# Patient Record
Sex: Male | Born: 2000
Health system: Southern US, Community
[De-identification: ages and names within clinical notes are randomized; demographics above are authoritative.]

---
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
## 2020-04-28 ENCOUNTER — Ambulatory Visit: Payer: Self-pay

## 2020-07-05 DIAGNOSIS — H5712 Ocular pain, left eye: Secondary | ICD-10-CM | POA: Diagnosis not present

## 2020-07-05 DIAGNOSIS — S0502XA Injury of conjunctiva and corneal abrasion without foreign body, left eye, initial encounter: Secondary | ICD-10-CM | POA: Diagnosis not present

## 2020-07-05 DIAGNOSIS — T1512XA Foreign body in conjunctival sac, left eye, initial encounter: Secondary | ICD-10-CM | POA: Diagnosis not present

## 2020-07-05 DIAGNOSIS — H1789 Other corneal scars and opacities: Secondary | ICD-10-CM | POA: Diagnosis not present

## 2020-07-17 DIAGNOSIS — T1502XA Foreign body in cornea, left eye, initial encounter: Secondary | ICD-10-CM | POA: Diagnosis not present

## 2021-02-20 ENCOUNTER — Other Ambulatory Visit: Payer: Self-pay

## 2021-02-20 ENCOUNTER — Emergency Department
Admission: RE | Admit: 2021-02-20 | Discharge: 2021-02-20 | Disposition: A | Discharge status: Home / Self Care | Payer: 59 | Source: Ambulatory Visit | Attending: Family Medicine | Admitting: Family Medicine

## 2021-02-20 VITALS — HR 73 | Temp 97.9°F | Wt 133.0 lb

## 2021-02-20 DIAGNOSIS — L255 Unspecified contact dermatitis due to plants, except food: Secondary | ICD-10-CM | POA: Diagnosis not present

## 2021-02-20 MED ORDER — METHYLPREDNISOLONE SODIUM SUCC 125 MG IJ SOLR
80.0000 mg | Freq: Once | INTRAMUSCULAR | Status: AC
  Administered 2021-02-20: 80 mg via INTRAMUSCULAR

## 2021-02-20 MED ORDER — PREDNISONE 20 MG PO TABS: ORAL_TABLET | ORAL | 0 refills | Status: DC

## 2021-02-20 NOTE — ED Triage Notes (Signed)
Patient c/o possible poison ivy or oak x 2 days.  On his arms and back.  Patient states that it's itching.  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  Also his roommate has poison ivy as well.  Patient has applied Calimine lotion without relief.

## 2021-02-20 NOTE — ED Provider Notes (Signed)
[facility]    CSN: [id number] Arrival date & time: 02/20/21  1858      History   Chief Complaint Chief Complaint  Patient presents with  . Rash    HPI Brandon Baldwin is a 20 y.o. male.   Patient works outdoors Therapist, music and believes that he contacted poison oak or ivy two days ago.  He developed a pruritic rash on his forearms and hands that is now spreading to his back, legs, and lower abdomen.  He feels well otherwise.  His roommate and co-worker has a poison ivy rash.  The history is provided by the patient.  Poison [place] This is a new problem. The problem occurs constantly. The problem has been gradually worsening. Nothing aggravates the symptoms. Nothing relieves the symptoms. Treatments tried: calamine lotion.    History reviewed. No pertinent past medical history.  There are no problems to display for this patient.   History reviewed. No pertinent surgical history.     Home Medications    Prior to Admission medications   Medication Sig Start Date End Date Taking? Authorizing Provider  predniSONE (DELTASONE) 20 MG tablet Take one tab by mouth twice daily for 4 days, then one daily for 3 days. Take with food. 02/20/21  Yes Lattie Haw, MD  albuterol (PROVENTIL HFA) 108 (90 Base) MCG/ACT inhaler Inhale 2 puffs into the lungs every 6 (six) hours as needed for wheezing or shortness of breath (wheezing). 01/26/18 02/25/18  Benay Pike, NP  azithromycin (ZITHROMAX) 250 MG tablet Take 2 tabs PO x 1 dose, then 1 tab PO QD x 4 days Patient not taking: No sig reported 11/10/17   Bing Neighbors, FNP  HYDROcodone-acetaminophen (NORCO/VICODIN) 5-325 MG tablet Take 2 tablets by mouth every 4 (four) hours as needed. 05/27/19   Mickie Bail, NP  ibuprofen (ADVIL) 800 MG tablet Take 1 tablet (800 mg total) by mouth every 8 (eight) hours as needed. 05/27/19   Mickie Bail, NP  ipratropium (ATROVENT) 0.03 % nasal spray Place 2 sprays into both nostrils  2 (two) times daily. Patient not taking: No sig reported 11/10/17   Bing Neighbors, FNP  montelukast (SINGULAIR) 10 MG tablet Take 1 tablet (10 mg total) by mouth at bedtime. 01/26/18 02/25/18  Benay Pike, NP  triamcinolone cream (KENALOG) 0.1 % Apply 1 application topically 2 (two) times daily. 06/01/18   Zachery Dauer, NP    Family History No family history on file.  Social History Social History   Tobacco Use  . Smoking status: Never Smoker  . Smokeless tobacco: Never Used     Allergies   Penicillins   Review of Systems Review of Systems  Constitutional: Negative for chills, diaphoresis, fatigue and fever.  HENT: Negative for mouth sores.   Skin: Positive for rash.  All other systems reviewed and are negative.    Physical Exam Triage Vital Signs ED Triage Vitals  Enc Vitals Group     BP --      Pulse Rate 02/20/21 2025 73     Resp --      Temp 02/20/21 2025 97.9 F (36.6 C)     Temp Source 02/20/21 2025 Oral     SpO2 02/20/21 2025 98 %     Weight 02/20/21 2026 133 lb (60.3 kg)     Height --      Head Circumference --      Peak Flow --      Pain Score  02/20/21 2026 0     Pain Loc --      Pain Edu? --      Excl. in [place]? --    No data found.  Updated Vital Signs Pulse 73   Temp 97.9 F (36.6 C) (Oral)   Wt 60.3 kg   SpO2 98%   BMI 20.22 kg/m   Visual Acuity Right Eye Distance:   Left Eye Distance:   Bilateral Distance:    Right Eye Near:   Left Eye Near:    Bilateral Near:     Physical Exam Vitals and nursing note reviewed.  Constitutional:      General: He is not in acute distress.    Appearance: He is not ill-appearing.  HENT:     Head: Normocephalic.     Mouth/Throat:     Pharynx: Oropharynx is clear.  Eyes:     Conjunctiva/sclera: Conjunctivae normal.  Cardiovascular:     Rate and Rhythm: Normal rate.  Pulmonary:     Effort: Pulmonary effort is normal.  Musculoskeletal:        General: No swelling.  Skin:    General:  Skin is warm and dry.     Findings: Rash present.          Comments: Scattered small maculopapular erythematous lesions in distribution as noted on diagram.  Some lesions are slightly vesicular.  Neurological:     Mental Status: He is alert and oriented to person, place, and time.      UC Treatments / Results  Labs (all labs ordered are listed, but only abnormal results are displayed) Labs Reviewed - No data to display  EKG   Radiology No results found.  Procedures Procedures (including critical care time)  Medications Ordered in UC Medications  methylPREDNISolone sodium succinate (SOLU-MEDROL) 125 mg/2 mL injection 80 mg (has no administration in time range)    Initial Impression / Assessment and Plan / UC Course  I have reviewed the triage vital signs and the nursing notes.  Pertinent labs & imaging results that were available during my care of the patient were reviewed by me and considered in my medical decision making (see chart for details).    Administered Solumedrol 80mg  IM, then begin prednisone burst/taper. Followup with Family Doctor if not improved in one week.    Final Clinical Impressions(s) / UC Diagnoses   Final diagnoses:  Rhus dermatitis     Discharge Instructions     Begin prednisone Saturday 02/21/21.  May take Benadryl at bedtime if needed for itching.    ED Prescriptions    Medication Sig Dispense Auth. Provider   predniSONE (DELTASONE) 20 MG tablet Take one tab by mouth twice daily for 4 days, then one daily for 3 days. Take with food. 11 tablet 02/23/21, MD        Lattie Haw, MD 02/21/21 [phone]

## 2021-02-20 NOTE — Discharge Instructions (Addendum)
Begin prednisone Saturday 02/21/21.  May take Benadryl at bedtime if needed for itching.

## 2021-04-26 IMAGING — DX LEFT WRIST - COMPLETE 3+ VIEW
4 series · 4 of 4 positions shown · non-contrast
Comparison: None.

CLINICAL DATA: Fall from skateboard

EXAM:
LEFT WRIST - COMPLETE 3+ VIEW

[wrist pa]
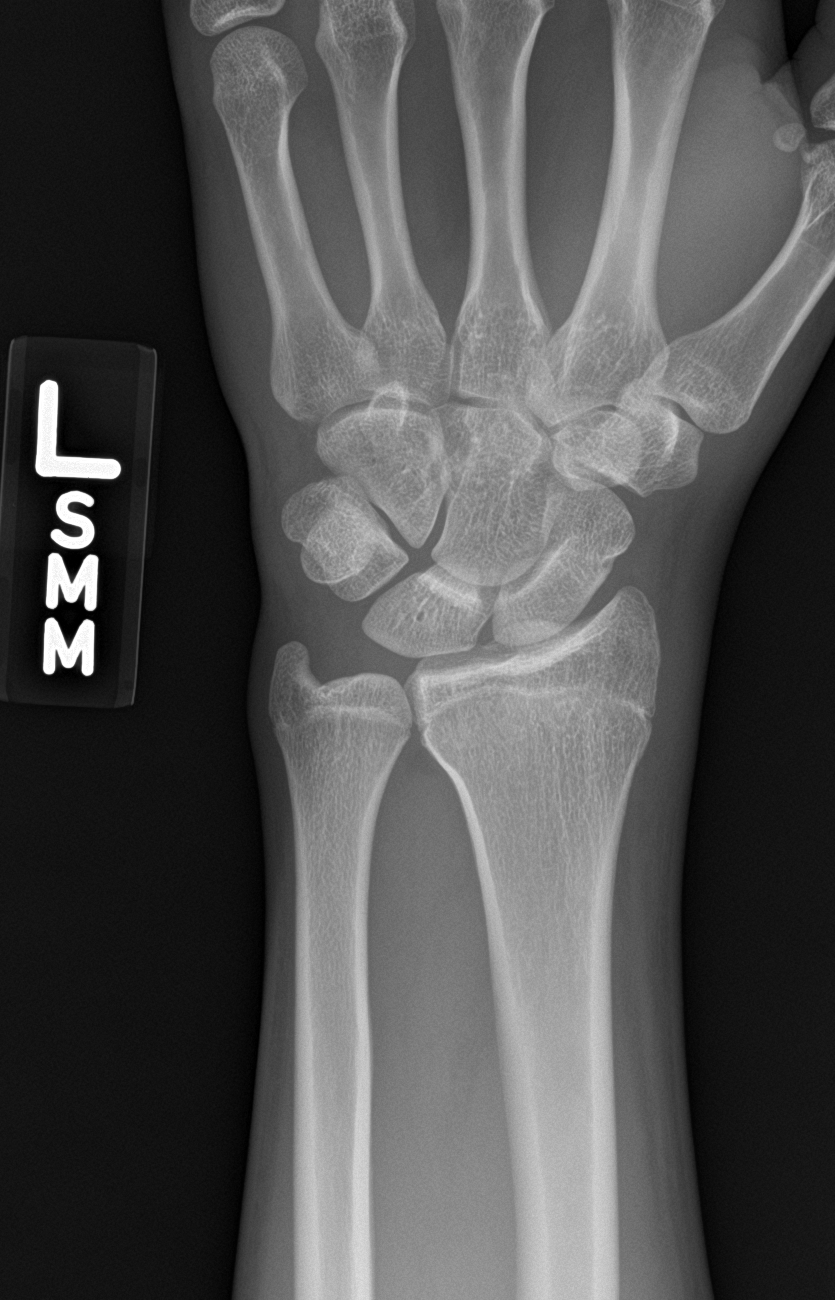

[wrist navicular]
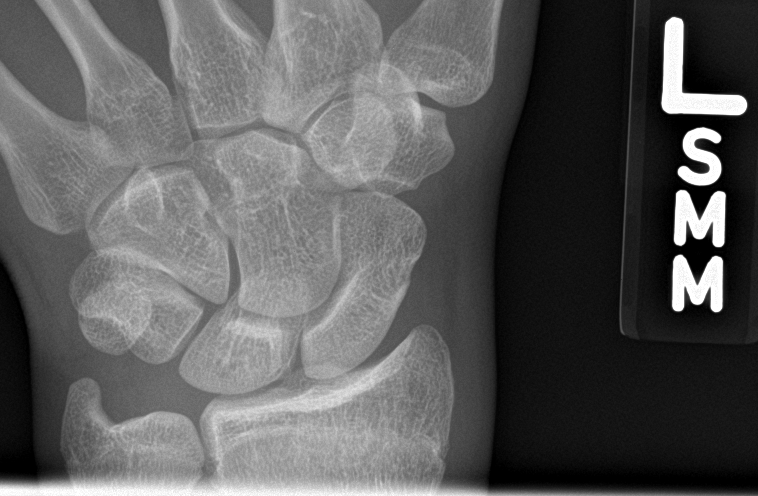

[wrist obl]
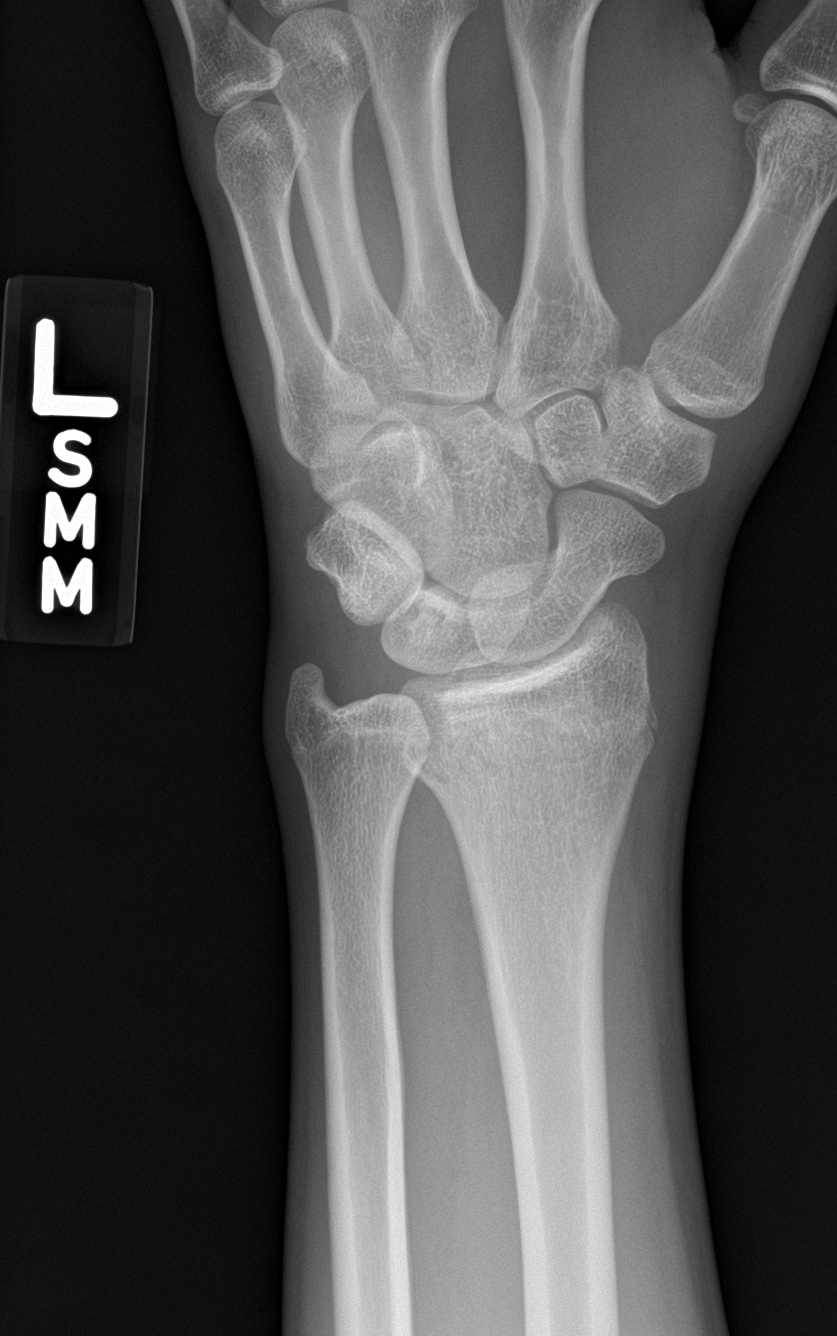

[wrist lat]
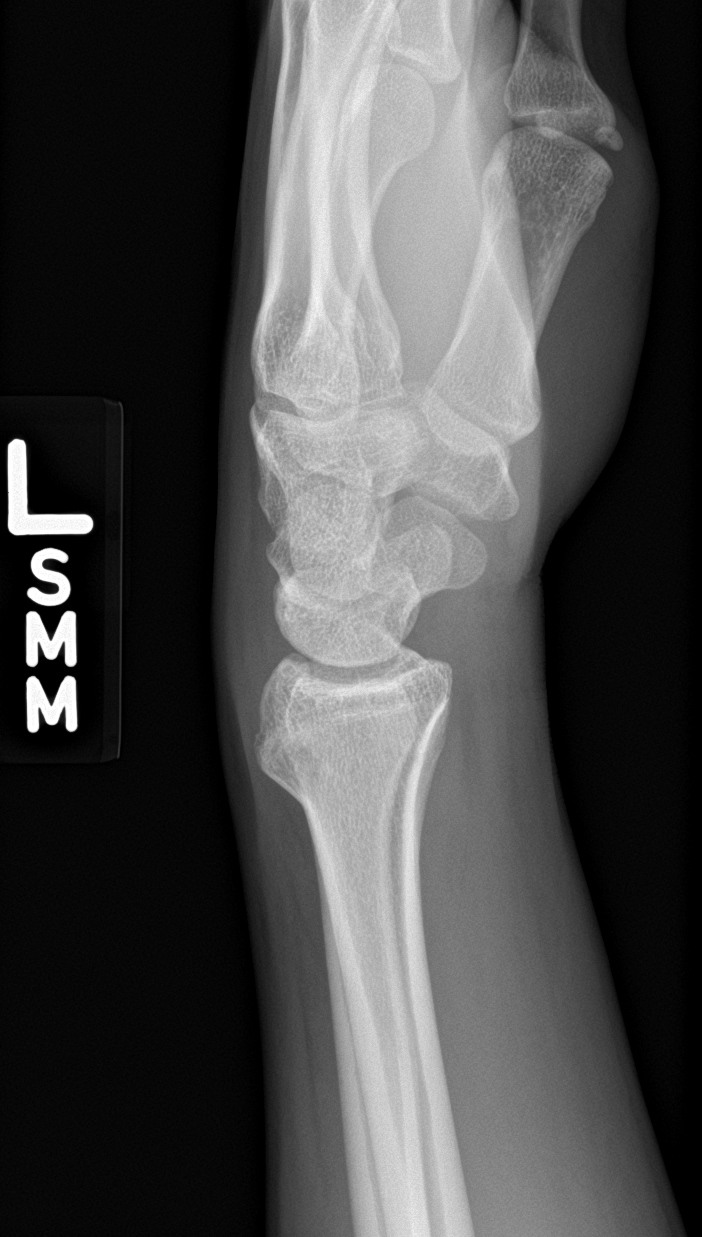

[4 of 4 positions shown; findings below may reference images not displayed]

FINDINGS: Frontal, oblique, lateral, and ulnar deviation scaphoid images were
obtained. There is a transversely oriented fracture of the distal
radial metaphysis with alignment essentially anatomic. No other
fracture. No dislocation. Joint spaces appear intact. No erosive
change.
IMPRESSION: Nondisplaced transversely oriented fracture distal radial
metaphysis. No other fracture. No dislocation. No evident
arthropathic change.

These results will be called to the ordering clinician or
representative by the Radiologist Assistant, and communication
documented in the PACS or zVision Dashboard.

## 2021-07-16 ENCOUNTER — Other Ambulatory Visit: Payer: Self-pay

## 2021-07-16 ENCOUNTER — Other Ambulatory Visit (HOSPITAL_COMMUNITY): Payer: Self-pay

## 2021-07-16 ENCOUNTER — Ambulatory Visit
Admission: RE | Admit: 2021-07-16 | Discharge: 2021-07-16 | Disposition: A | Discharge status: Home / Self Care | Payer: 59 | Source: Ambulatory Visit | Attending: Emergency Medicine | Admitting: Emergency Medicine

## 2021-07-16 VITALS — BP 115/77 | HR 67 | Temp 97.6°F | Resp 14

## 2021-07-16 DIAGNOSIS — L237 Allergic contact dermatitis due to plants, except food: Secondary | ICD-10-CM | POA: Diagnosis not present

## 2021-07-16 MED ORDER — PREDNISONE 10 MG PO TABS
ORAL_TABLET | ORAL | 0 refills | Status: DC
  Filled 2021-07-16: qty 21, 6d supply, fill #0

## 2021-07-16 MED ORDER — TRIAMCINOLONE ACETONIDE 0.1 % EX CREA
1.0000 "application " | TOPICAL_CREAM | Freq: Two times a day (BID) | CUTANEOUS | 0 refills | Status: DC
  Filled 2021-07-16: qty 80, 30d supply, fill #0

## 2021-07-16 MED ORDER — METHYLPREDNISOLONE SODIUM SUCC 125 MG IJ SOLR
125.0000 mg | Freq: Once | INTRAMUSCULAR | Status: AC
  Administered 2021-07-16: 125 mg via INTRAMUSCULAR

## 2021-07-16 MED ORDER — CETIRIZINE HCL 10 MG PO CAPS
10.0000 mg | ORAL_CAPSULE | Freq: Every day | ORAL | 0 refills | Status: AC
  Filled 2021-07-16: qty 10, 10d supply, fill #0

## 2021-07-16 NOTE — Discharge Instructions (Signed)
We gave you an injection of Solu-Medrol Begin prednisone taper x6 days-begin with 6 tablets on day 1, decrease by 1 tablet each day until complete-6, 5, 4, 3, 2, 1-take with food and earlier in the day if possible May use triamcinolone cream for areas still itchy Continue antihistamines-daily cetirizine/Zyrtec or loratadine/Allegra during the day, supplement Benadryl in the evening Please follow-up if rash not resolving or worsening

## 2021-07-16 NOTE — ED Triage Notes (Signed)
Pt reports possible poison ivy that started last night around 5 pm.   Pt does have rashes to left & right neck and right AC.   Pt states he has been using cream to area (Poison ivy lotion)

## 2021-07-16 NOTE — ED Provider Notes (Signed)
[facility]    CSN: [id number] Arrival date & time: 07/16/21  1249      History   Chief Complaint Chief Complaint  Patient presents with   Poison Ivy    HPI Brandon Baldwin is a 20 y.o. male presenting today for evaluation of a rash.  Began to break out in a rash beginning yesterday.  Similar to past reactions to poison ivy.  Feels involvement of face especially near right eye, neck and upper extremities.  HPI  No past medical history on file.  There are no problems to display for this patient.   No past surgical history on file.     Home Medications    Prior to Admission medications   Medication Sig Start Date End Date Taking? Authorizing Provider  Cetirizine HCl 10 MG CAPS Take 1 capsule (10 mg total) by mouth daily for 10 days. 07/16/21 07/26/21 Yes Hind Chesler C, PA-C  predniSONE (DELTASONE) 10 MG tablet Take 6 tablets by mouth on day 1 and decrease by 1 tablet daily-6,5,4,3,2,1. Take with food. 07/16/21  Yes Michalina Calbert C, PA-C  triamcinolone cream (KENALOG) 0.1 % Apply 1 to affected areas topically 2 (two) times daily. 07/16/21  Yes Mubarak Bevens C, PA-C  ibuprofen (ADVIL) 800 MG tablet Take 1 tablet (800 mg total) by mouth every 8 (eight) hours as needed. 05/27/19   Mickie Bail, NP    Family History No family history on file.  Social History Social History   Tobacco Use   Smoking status: Never   Smokeless tobacco: Never  Vaping Use   Vaping Use: Every day  Substance Use Topics   Alcohol use: Yes   Drug use: Yes    Types: Marijuana     Allergies   Penicillins   Review of Systems Review of Systems  Constitutional:  Negative for fatigue and fever.  Eyes:  Negative for redness, itching and visual disturbance.  Respiratory:  Negative for shortness of breath.   Cardiovascular:  Negative for chest pain and leg swelling.  Gastrointestinal:  Negative for nausea and vomiting.  Musculoskeletal:  Negative for arthralgias and myalgias.   Skin:  Positive for color change and rash. Negative for wound.  Neurological:  Negative for dizziness, syncope, weakness, light-headedness and headaches.    Physical Exam Triage Vital Signs ED Triage Vitals  Enc Vitals Group     BP 07/16/21 1335 115/77     Pulse Rate 07/16/21 1335 67     Resp 07/16/21 1335 14     Temp 07/16/21 1335 97.6 F (36.4 C)     Temp Source 07/16/21 1335 Oral     SpO2 07/16/21 1335 98 %     Weight --      Height --      Head Circumference --      Peak Flow --      Pain Score 07/16/21 1350 0     Pain Loc --      Pain Edu? --      Excl. in [place]? --    No data found.  Updated Vital Signs BP 115/77 (BP Location: Left Arm)   Pulse 67   Temp 97.6 F (36.4 C) (Oral)   Resp 14   SpO2 98%   Visual Acuity Right Eye Distance:   Left Eye Distance:   Bilateral Distance:    Right Eye Near:   Left Eye Near:    Bilateral Near:     Physical Exam Vitals and nursing note  reviewed.  Constitutional:      Appearance: He is well-developed.     Comments: No acute distress  HENT:     Head: Normocephalic and atraumatic.     Nose: Nose normal.  Eyes:     Conjunctiva/sclera: Conjunctivae normal.  Cardiovascular:     Rate and Rhythm: Normal rate.  Pulmonary:     Effort: Pulmonary effort is normal. No respiratory distress.     Comments: Speaking in full sentences Abdominal:     General: There is no distension.  Musculoskeletal:        General: Normal range of motion.     Cervical back: Neck supple.  Skin:    General: Skin is warm and dry.     Comments: Erythematous papular/vesicular rash noted to extremities, erythema noted to neck and slightly on lower face, no obvious lesions near eyes  Neurological:     Mental Status: He is alert and oriented to person, place, and time.     UC Treatments / Results  Labs (all labs ordered are listed, but only abnormal results are displayed) Labs Reviewed - No data to display  EKG   Radiology No results  found.  Procedures Procedures (including critical care time)  Medications Ordered in UC Medications  methylPREDNISolone sodium succinate (SOLU-MEDROL) 125 mg/2 mL injection 125 mg (125 mg Intramuscular Given 07/16/21 1450)    Initial Impression / Assessment and Plan / UC Course  I have reviewed the triage vital signs and the nursing notes.  Pertinent labs & imaging results that were available during my care of the patient were reviewed by me and considered in my medical decision making (see chart for details).     Contact dermatitis secondary to poison ivy, providing Solu-Medrol prior to discharge and continuing on prednisone taper x6 days, triamcinolone topically and continuing antihistamines.  Continue to monitor rash for resolution,Discussed strict return precautions. Patient verbalized understanding and is agreeable with plan.  Final Clinical Impressions(s) / UC Diagnoses   Final diagnoses:  Poison ivy dermatitis     Discharge Instructions      We gave you an injection of Solu-Medrol Begin prednisone taper x6 days-begin with 6 tablets on day 1, decrease by 1 tablet each day until complete-6, 5, 4, 3, 2, 1-take with food and earlier in the day if possible May use triamcinolone cream for areas still itchy Continue antihistamines-daily cetirizine/Zyrtec or loratadine/Allegra during the day, supplement Benadryl in the evening Please follow-up if rash not resolving or worsening     ED Prescriptions     Medication Sig Dispense Auth. Provider   predniSONE (DELTASONE) 10 MG tablet Take 6 tablets by mouth on day 1 and decrease by 1 tablet daily-6,5,4,3,2,1. Take with food. 21 tablet Quirino Kakos C, PA-C   Cetirizine HCl 10 MG CAPS Take 1 capsule (10 mg total) by mouth daily for 10 days. 10 capsule Angellee Cohill C, PA-C   triamcinolone cream (KENALOG) 0.1 % Apply 1 to affected areas topically 2 (two) times daily. 80 g Akaysha Cobern, [place] C, PA-C      PDMP not reviewed this  encounter.   Lew Dawes, [place] 07/17/21 [phone]

## 2021-07-17 ENCOUNTER — Other Ambulatory Visit (HOSPITAL_COMMUNITY): Payer: Self-pay

## 2021-08-26 DIAGNOSIS — T1502XA Foreign body in cornea, left eye, initial encounter: Secondary | ICD-10-CM | POA: Diagnosis not present

## 2022-06-09 ENCOUNTER — Ambulatory Visit
Admission: EM | Admit: 2022-06-09 | Discharge: 2022-06-09 | Disposition: A | Discharge status: Home / Self Care | Payer: 59 | Attending: Emergency Medicine | Admitting: Emergency Medicine

## 2022-06-09 DIAGNOSIS — Z20822 Contact with and (suspected) exposure to covid-19: Secondary | ICD-10-CM | POA: Diagnosis not present

## 2022-06-09 DIAGNOSIS — B349 Viral infection, unspecified: Secondary | ICD-10-CM

## 2022-06-09 LAB — RESP PANEL BY RT-PCR (FLU A&B, COVID) ARPGX2
Influenza A by PCR: NEGATIVE
Influenza B by PCR: NEGATIVE
SARS Coronavirus 2 by RT PCR: NEGATIVE

## 2022-06-09 MED ORDER — OSELTAMIVIR PHOSPHATE 75 MG PO CAPS: 75.0000 mg | ORAL_CAPSULE | Freq: Two times a day (BID) | ORAL | 0 refills | Status: AC

## 2022-06-09 NOTE — ED Triage Notes (Signed)
Pt c/o chills, sore throat, back aches, and headache that began yesterday.   Home interventions: sudafed, motrin

## 2022-06-09 NOTE — ED Provider Notes (Signed)
[facility]    CSN: [id number] Arrival date & time: 06/09/22  1634    HISTORY   Chief Complaint  Patient presents with   Generalized Body Aches   Headache   Sore Throat   HPI Brandon Baldwin is a pleasant, 21 y.o. male who presents to urgent care today. Patient complains of chills, congestion, runny nose, sore throat, backache and headache that began yesterday.  Patient states has been taking Sudafed and Motrin without meaningful relief of symptoms.  On arrival, patient's blood pressure and oxygen saturation is normal however he has a temperature of 100.9 and a pulse of 111.  Patient denies known sick contacts, nausea, vomiting, diarrhea, cough.  The history is provided by the patient.   History reviewed. No pertinent past medical history. There are no problems to display for this patient.  History reviewed. No pertinent surgical history.  Home Medications    Prior to Admission medications   Medication Sig Start Date End Date Taking? Authorizing Provider  Cetirizine HCl 10 MG CAPS Take 1 capsule (10 mg total) by mouth daily for 10 days. 07/16/21 07/26/21  Wieters, Elesa Hacker, PA-C    Family History History reviewed. No pertinent family history. Social History Social History   Tobacco Use   Smoking status: Never   Smokeless tobacco: Never  Vaping Use   Vaping Use: Every day  Substance Use Topics   Alcohol use: Yes   Drug use: Yes    Types: Marijuana   Allergies   Penicillins  Review of Systems Review of Systems Pertinent findings revealed after performing a 14 point review of systems has been noted in the history of present illness.  Physical Exam Triage Vital Signs ED Triage Vitals  Enc Vitals Group     BP 08/21/21 0827 (!) 147/82     Pulse Rate 08/21/21 0827 72     Resp 08/21/21 0827 18     Temp 08/21/21 0827 98.3 F (36.8 C)     Temp Source 08/21/21 0827 Oral     SpO2 08/21/21 0827 98 %     Weight --      Height --      Head Circumference  --      Peak Flow --      Pain Score 08/21/21 0826 5     Pain Loc --      Pain Edu? --      Excl. in [place]? --   No data found.  Updated Vital Signs BP 112/76 (BP Location: Right Arm)   Pulse (!) 111   Temp (!) 100.9 F (38.3 C) (Oral)   Resp 16   SpO2 97%   Physical Exam Constitutional:      Appearance: He is ill-appearing.  HENT:     Head: Normocephalic and atraumatic.     Salivary Glands: Right salivary gland is not diffusely enlarged or tender. Left salivary gland is not diffusely enlarged or tender.     Right Ear: Hearing, tympanic membrane, ear canal and external ear normal.     Left Ear: Hearing, tympanic membrane, ear canal and external ear normal.     Nose: Congestion and rhinorrhea present. Rhinorrhea is clear.     Right Sinus: No maxillary sinus tenderness or frontal sinus tenderness.     Left Sinus: No maxillary sinus tenderness.     Mouth/Throat:     Mouth: Mucous membranes are moist.     Pharynx: Pharyngeal swelling, posterior oropharyngeal erythema and uvula swelling present. No oropharyngeal  exudate.     Tonsils: No tonsillar exudate. 0 on the right. 0 on the left.  Eyes:     General: Lids are normal. Vision grossly intact.     Extraocular Movements: Extraocular movements intact.     Conjunctiva/sclera: Conjunctivae normal.     Pupils: Pupils are equal, round, and reactive to light.  Cardiovascular:     Rate and Rhythm: Regular rhythm. Tachycardia present. No extrasystoles are present.    Pulses: Normal pulses.     Heart sounds: Normal heart sounds, S1 normal and S2 normal. No murmur heard. Pulmonary:     Effort: Pulmonary effort is normal. No accessory muscle usage, prolonged expiration or respiratory distress.     Breath sounds: No stridor. No wheezing, rhonchi or rales.     Comments: Turbulent breath sounds throughout without wheeze, rale, rhonchi. Abdominal:     General: Abdomen is flat. Bowel sounds are normal.     Palpations: Abdomen is soft.   Musculoskeletal:        General: Normal range of motion.     Cervical back: Full passive range of motion without pain, normal range of motion and neck supple.  Lymphadenopathy:     Cervical: Cervical adenopathy present.     Right cervical: Superficial cervical adenopathy and posterior cervical adenopathy present.     Left cervical: Superficial cervical adenopathy and posterior cervical adenopathy present.  Skin:    General: Skin is warm and dry.  Neurological:     General: No focal deficit present.     Mental Status: He is alert and oriented to person, place, and time.     Motor: Motor function is intact.     Coordination: Coordination is intact.     Gait: Gait is intact.     Deep Tendon Reflexes: Reflexes are normal and symmetric.  Psychiatric:        Attention and Perception: Attention and perception normal.        Mood and Affect: Mood and affect normal.        Speech: Speech normal.        Behavior: Behavior normal. Behavior is cooperative.        Thought Content: Thought content normal.     Visual Acuity Right Eye Distance:   Left Eye Distance:   Bilateral Distance:    Right Eye Near:   Left Eye Near:    Bilateral Near:     UC Couse / Diagnostics / Procedures:     Radiology No results found.  Procedures Procedures (including critical care time) EKG  Pending results:  Labs Reviewed  POCT INFLUENZA A/B    Medications Ordered in UC: Medications - No data to display  UC Diagnoses / Final Clinical Impressions(s)   I have reviewed the triage vital signs and the nursing notes.  Pertinent labs & imaging results that were available during my care of the patient were reviewed by me and considered in my medical decision making (see chart for details).    Final diagnoses:  Viral illness   Rapid antigen test for influenza was negative today, patient advised to begin Tamiflu despite this.  PCR COVID and flu testing performed.  We will adjust treatment based on  results of testing.  Patient advised to remain off from work for the next several days until feeling better.  Conservative care recommended.  Return precautions advised.  ED Prescriptions     Medication Sig Dispense Auth. Provider   oseltamivir (TAMIFLU) 75 MG capsule Take 1 capsule (  75 mg total) by mouth every 12 (twelve) hours for 5 days. 10 capsule Theadora Rama Scales, PA-C      PDMP not reviewed this encounter.  Disposition Upon Discharge:  Condition: stable for discharge home Home: take medications as prescribed; routine discharge instructions as discussed; follow up as advised.  Patient presented with an acute illness with associated systemic symptoms and significant discomfort requiring urgent management. In my opinion, this is a condition that a prudent lay person (someone who possesses an average knowledge of health and medicine) may potentially expect to result in complications if not addressed urgently such as respiratory distress, impairment of bodily function or dysfunction of bodily organs.   Routine symptom specific, illness specific and/or disease specific instructions were discussed with the patient and/or caregiver at length.   As such, the patient has been evaluated and assessed, work-up was performed and treatment was provided in alignment with urgent care protocols and evidence based medicine.  Patient/parent/caregiver has been advised that the patient may require follow up for further testing and treatment if the symptoms continue in spite of treatment, as clinically indicated and appropriate.  If the patient was tested for COVID-19, Influenza and/or RSV, then the patient/parent/guardian was advised to isolate at home pending the results of his/her diagnostic coronavirus test and potentially longer if they're positive. I have also advised pt that if his/her COVID-19 test returns positive, it's recommended to self-isolate for at least 10 days after symptoms first appeared  AND until fever-free for 24 hours without fever reducer AND other symptoms have improved or resolved. Discussed self-isolation recommendations as well as instructions for household member/close contacts as per the [facility] [place] [facility], and also gave patient the COVID packet with this information.  Patient/parent/caregiver has been advised to return to the [facility] or PCP in 3-5 days if no better; to PCP or the Emergency Department if new signs and symptoms develop, or if the current signs or symptoms continue to change or worsen for further workup, evaluation and treatment as clinically indicated and appropriate  The patient will follow up with their current PCP if and as advised. If the patient does not currently have a PCP we will assist them in obtaining one.   The patient may need specialty follow up if the symptoms continue, in spite of conservative treatment and management, for further workup, evaluation, consultation and treatment as clinically indicated and appropriate.  Patient/parent/caregiver verbalized understanding and agreement of plan as discussed.  All questions were addressed during visit.  Please see discharge instructions below for further details of plan.  Discharge Instructions:   Discharge Instructions      Your symptoms and physical exam findings are concerning for a viral respiratory infection.    Your rapid influenza antigen test today was negative. I recommend that you begin taking Tamiflu now for empiric treatment of presumed influenza based on the history provided to me today along with my physical exam findings despite the negative rapid test which does not catch all cases of influenza.   Tamiflu is an antiviral medication that decreases the severity, duration and transmissibility of influenza virus by preventing the virus from reproducing itself in your body.  Please take 1 capsule twice daily for the next 5 days.  If sent a prescription to your pharmacy.   You were tested  for both COVID-19 and influenza using a PCR test today.  The results of your viral testing will be posted to your MyChart once it is complete, typically this takes  6 to 12 hours.    If your influenza test is positive, please finish the full 5-day course of Tamiflu.    If your COVID-19 test is positive, you will be contacted by phone and further recommendations will be provided for you.  Please discontinue Tamiflu.    Rest, drinking plenty of clear liquids and time for the best known cares for influenza.  I recommend that you anticipate returning to work on Monday and remain out of work longer if you are still not feeling well.  Thank you for visiting urgent care today.         This office note has been dictated using Museum/gallery curator.  Unfortunately, this method of dictation can sometimes lead to typographical or grammatical errors.  I apologize for your inconvenience in advance if this occurs.  Please do not hesitate to reach out to me if clarification is needed.      Lynden Oxford Scales, PA-C 06/09/22 1734

## 2022-06-09 NOTE — Discharge Instructions (Addendum)
Your symptoms and physical exam findings are concerning for a viral respiratory infection.    Your rapid influenza antigen test today was negative. I recommend that you begin taking Tamiflu now for empiric treatment of presumed influenza based on the history provided to me today along with my physical exam findings despite the negative rapid test which does not catch all cases of influenza.   Tamiflu is an antiviral medication that decreases the severity, duration and transmissibility of influenza virus by preventing the virus from reproducing itself in your body.  Please take 1 capsule twice daily for the next 5 days.  If sent a prescription to your pharmacy.   You were tested for both COVID-19 and influenza using a PCR test today.  The results of your viral testing will be posted to your MyChart once it is complete, typically this takes 6 to 12 hours.    If your influenza test is positive, please finish the full 5-day course of Tamiflu.    If your COVID-19 test is positive, you will be contacted by phone and further recommendations will be provided for you.  Please discontinue Tamiflu.    Rest, drinking plenty of clear liquids and time for the best known cares for influenza.  I recommend that you anticipate returning to work on Monday and remain out of work longer if you are still not feeling well.  Thank you for visiting urgent care today.

## 2022-06-11 ENCOUNTER — Ambulatory Visit
Admission: EM | Admit: 2022-06-11 | Discharge: 2022-06-11 | Disposition: A | Discharge status: Home / Self Care | Payer: 59 | Attending: Urgent Care | Admitting: Urgent Care

## 2022-06-11 DIAGNOSIS — J029 Acute pharyngitis, unspecified: Secondary | ICD-10-CM | POA: Diagnosis not present

## 2022-06-11 DIAGNOSIS — R070 Pain in throat: Secondary | ICD-10-CM | POA: Diagnosis not present

## 2022-06-11 DIAGNOSIS — R509 Fever, unspecified: Secondary | ICD-10-CM | POA: Diagnosis not present

## 2022-06-11 LAB — POCT RAPID STREP A (OFFICE): Rapid Strep A Screen: NEGATIVE

## 2022-06-11 MED ORDER — CLINDAMYCIN HCL 300 MG PO CAPS: 300.0000 mg | ORAL_CAPSULE | Freq: Three times a day (TID) | ORAL | 0 refills | Status: AC

## 2022-06-11 MED ORDER — IBUPROFEN 800 MG PO TABS
800.0000 mg | ORAL_TABLET | Freq: Once | ORAL | Status: AC
  Administered 2022-06-11: 800 mg via ORAL

## 2022-06-11 NOTE — ED Triage Notes (Signed)
Pt here with sore throat, fever, headaches and emesis that began 4 days ago.

## 2022-06-11 NOTE — ED Provider Notes (Signed)
[facility]   MRN: [id number] DOB: Dec 07, 2000  Subjective:   Brandon Baldwin is a 21 y.o. male presenting for 4 day history of acute onset throat pain, painful swallowing, nausea with vomiting and fevers. Has taken Tamiflu from his visit on 06/09/2022. Tested negative for flu, COVID 19. Has used Tylenol for fevers, pain. No runny or stuffy nose, ear pain, sinus pain, cough, chest pain, shob, wheezing.    Current Facility-Administered Medications:    ibuprofen (ADVIL) tablet 800 mg, 800 mg, Oral, Once, Wallis Bamberg, PA-C  Current Outpatient Medications:    Cetirizine HCl 10 MG CAPS, Take 1 capsule (10 mg total) by mouth daily for 10 days., Disp: 10 capsule, Rfl: 0   oseltamivir (TAMIFLU) 75 MG capsule, Take 1 capsule (75 mg total) by mouth every 12 (twelve) hours for 5 days., Disp: 10 capsule, Rfl: 0   Allergies  Allergen Reactions   Penicillins Rash    History reviewed. No pertinent past medical history.   History reviewed. No pertinent surgical history.  History reviewed. No pertinent family history.  Social History   Tobacco Use   Smoking status: Never   Smokeless tobacco: Never  Vaping Use   Vaping Use: Every day  Substance Use Topics   Alcohol use: Yes   Drug use: Yes    Types: Marijuana    ROS   Objective:   Vitals: BP 107/72 (BP Location: Left Arm)   Pulse (!) 105   Temp (!) 102.5 F (39.2 C) (Oral)   Resp 16   SpO2 95%   Physical Exam Constitutional:      General: He is not in acute distress.    Appearance: Normal appearance. He is well-developed and normal weight. He is not ill-appearing, toxic-appearing or diaphoretic.  HENT:     Head: Normocephalic and atraumatic.     Right Ear: External ear normal.     Left Ear: External ear normal.     Nose: Nose normal.     Mouth/Throat:     Pharynx: Oropharynx is clear. No pharyngeal swelling, oropharyngeal exudate, posterior oropharyngeal erythema or uvula swelling.     Tonsils:  No tonsillar exudate or tonsillar abscesses. 0 on the right. 0 on the left.  Eyes:     General: No scleral icterus.       Right eye: No discharge.        Left eye: No discharge.     Extraocular Movements: Extraocular movements intact.  Cardiovascular:     Rate and Rhythm: Normal rate.  Pulmonary:     Effort: Pulmonary effort is normal.  Musculoskeletal:     Cervical back: Normal range of motion.  Neurological:     Mental Status: He is alert and oriented to person, place, and time.  Psychiatric:        Mood and Affect: Mood normal.        Behavior: Behavior normal.        Thought Content: Thought content normal.        Judgment: Judgment normal.    Patient given ibuprofen in clinic.  Results for orders placed or performed during the hospital encounter of 06/11/22 (from the past 24 hour(s))  POCT rapid strep A     Status: Normal   Collection Time: 06/11/22  9:47 AM  Result Value Ref Range   Rapid Strep A Screen Negative Negative   Monospot is negative as well.  Assessment and Plan :   PDMP not reviewed this encounter.  1. Acute pharyngitis, unspecified etiology   2. Throat pain   3. Fever, unspecified    Will treat empirically for pharyngitis given physical exam findings.  Patient is to start clindamycin given penicillin allergy, use supportive care otherwise. Counseled patient on potential for adverse effects with medications prescribed/recommended today, ER and return-to-clinic precautions discussed, patient verbalized understanding.    Wallis Bamberg, PA-C 06/11/22 1234

## 2022-06-11 NOTE — ED Notes (Addendum)
Symptoms started on Tuesday.  Fever, sore throat, Cold Chills, Vomiting (last time yesterday) been able to drink fluids and keep them down since last episode. Was seen on 06/08/22 and was tested for Covid & Flu both were negative.   Patient states his still taking the tamiflu along with Tylenol(last dose 30 mins before arrival). Also taking Dayquil and Nightquil  Sore throat is the biggest concern today Pain 8/10

## 2022-08-12 ENCOUNTER — Ambulatory Visit
Admission: EM | Admit: 2022-08-12 | Discharge: 2022-08-12 | Disposition: A | Discharge status: Home / Self Care | Payer: 59 | Attending: Urgent Care | Admitting: Urgent Care

## 2022-08-12 DIAGNOSIS — L255 Unspecified contact dermatitis due to plants, except food: Secondary | ICD-10-CM

## 2022-08-12 MED ORDER — PREDNISONE 20 MG PO TABS: ORAL_TABLET | ORAL | 0 refills | Status: AC

## 2022-08-12 MED ORDER — HYDROXYZINE HCL 25 MG PO TABS: 12.5000 mg | ORAL_TABLET | Freq: Three times a day (TID) | ORAL | 0 refills | Status: AC | PRN

## 2022-08-12 NOTE — ED Triage Notes (Signed)
Pt presents with c/o poison ivy that began Monday.

## 2022-08-12 NOTE — ED Provider Notes (Signed)
[facility]  Note:  This document was prepared using Conservation officer, historic buildings and may include unintentional dictation errors.  MRN: [id number] DOB: 03/08/2001  Subjective:   Brandon Baldwin is a 21 y.o. male presenting for persistent worsening itchy rash over the forearms, left side of his neck, left face bordering the eye, behind his ears.  Patient believes he came into contact with poison ivy as he has had previous similar reactions.  He does a lot of yard work, work with trees and came into contact with his this past week.  No other new exposures.  No oral swelling, chest tightness, nausea, vomiting, belly pain.  No current facility-administered medications for this encounter.  Current Outpatient Medications:    Cetirizine HCl 10 MG CAPS, Take 1 capsule (10 mg total) by mouth daily for 10 days., Disp: 10 capsule, Rfl: 0   clindamycin (CLEOCIN) 300 MG capsule, Take 1 capsule (300 mg total) by mouth 3 (three) times daily., Disp: 30 capsule, Rfl: 0   Allergies  Allergen Reactions   Penicillins Rash    History reviewed. No pertinent past medical history.   History reviewed. No pertinent surgical history.  History reviewed. No pertinent family history.  Social History   Tobacco Use   Smoking status: Never   Smokeless tobacco: Never  Vaping Use   Vaping Use: Every day  Substance Use Topics   Alcohol use: Yes   Drug use: Yes    Types: Marijuana    ROS   Objective:   Vitals: BP 119/77 (BP Location: Left Arm)   Pulse 87   Temp 98.2 F (36.8 C) (Oral)   Resp 12   SpO2 97%   Physical Exam Constitutional:      General: He is not in acute distress.    Appearance: Normal appearance. He is well-developed and normal weight. He is not ill-appearing, toxic-appearing or diaphoretic.  HENT:     Head: Normocephalic and atraumatic.     Right Ear: External ear normal.     Left Ear: External ear normal.     Nose: Nose normal.      Mouth/Throat:     Pharynx: Oropharynx is clear.  Eyes:     General: No scleral icterus.       Right eye: No discharge.        Left eye: No discharge.     Extraocular Movements: Extraocular movements intact.  Cardiovascular:     Rate and Rhythm: Normal rate.  Pulmonary:     Effort: Pulmonary effort is normal.  Musculoskeletal:     Cervical back: Normal range of motion.  Skin:    Findings: Rash (multiple patches of erythema, excoriations and linear distributions over the forearm, left side of his neck, left side of his face bordering the eye, behind both ears) present.  Neurological:     Mental Status: He is alert and oriented to person, place, and time.  Psychiatric:        Mood and Affect: Mood normal.        Behavior: Behavior normal.        Thought Content: Thought content normal.        Judgment: Judgment normal.     Assessment and Plan :   PDMP not reviewed this encounter.  1. Rhus dermatitis     We will use a 15-day steroid course to address contact dermatitis.  Use Vistaril for severe itching. Counseled patient on potential for adverse effects with medications prescribed/recommended today,  ER and return-to-clinic precautions discussed, patient verbalized understanding.    Jaynee Eagles, PA-C 08/12/22 1626

## 2023-06-10 ENCOUNTER — Ambulatory Visit (INDEPENDENT_AMBULATORY_CARE_PROVIDER_SITE_OTHER): Payer: Commercial Managed Care - PPO

## 2023-06-10 ENCOUNTER — Other Ambulatory Visit (HOSPITAL_COMMUNITY): Payer: Self-pay

## 2023-06-10 ENCOUNTER — Ambulatory Visit
Admission: EM | Admit: 2023-06-10 | Discharge: 2023-06-10 | Disposition: A | Discharge status: Home / Self Care | Payer: Commercial Managed Care - PPO | Attending: Internal Medicine | Admitting: Internal Medicine

## 2023-06-10 DIAGNOSIS — S62512A Displaced fracture of proximal phalanx of left thumb, initial encounter for closed fracture: Secondary | ICD-10-CM | POA: Diagnosis not present

## 2023-06-10 DIAGNOSIS — M79645 Pain in left finger(s): Secondary | ICD-10-CM

## 2023-06-10 DIAGNOSIS — S66412A Strain of intrinsic muscle, fascia and tendon of left thumb at wrist and hand level, initial encounter: Secondary | ICD-10-CM | POA: Diagnosis not present

## 2023-06-10 MED ORDER — NAPROXEN 500 MG PO TABS
500.0000 mg | ORAL_TABLET | Freq: Two times a day (BID) | ORAL | 0 refills | Status: AC
  Filled 2023-06-10: qty 30, 15d supply, fill #0

## 2023-06-10 NOTE — Discharge Instructions (Signed)
Wear the thumb spica splint at all times, do not take the splint off.  Follow-up with your orthopedist at [facility] soon as possible.  Use naproxen for pain and inflammation.

## 2023-06-10 NOTE — ED Triage Notes (Addendum)
Pt reports injury to left hand/thumb doing cartwheels ~6 days ago-last ibuprofen ~630am-NAD-steady gait

## 2023-06-10 NOTE — ED Provider Notes (Signed)
[facility]  Note:  This document was prepared using Conservation officer, historic buildings and may include unintentional dictation errors.  MRN: [id number] DOB: [birth year]  Subjective:   Brandon Baldwin is a 22 y.o. male presenting for 1 week history of persistent left thumb pain from doing a cart wheel.  Cannot recall exactly how he injured it but knows it was from doing cart wheels.  He has continued to work and has been using ibuprofen but the pain is not getting any better.  No bruising, bony deformity, swelling.  No wounds, warmth or erythema.  No current facility-administered medications for this encounter.  Current Outpatient Medications:    Cetirizine HCl 10 MG CAPS, Take 1 capsule (10 mg total) by mouth daily for 10 days. (Patient not taking: Reported on 08/12/2022), Disp: 10 capsule, Rfl: 0   clindamycin (CLEOCIN) 300 MG capsule, Take 1 capsule (300 mg total) by mouth 3 (three) times daily. (Patient not taking: Reported on 08/12/2022), Disp: 30 capsule, Rfl: 0   hydrOXYzine (ATARAX) 25 MG tablet, Take 0.5-1 tablets (12.5-25 mg total) by mouth every 8 (eight) hours as needed for itching., Disp: 30 tablet, Rfl: 0   predniSONE (DELTASONE) 20 MG tablet, Day 1-5: Take 3 tablets daily. Day 6-10: Take 2 tablets daily. Day 11-15: Take 1 tablet daily. Take tablets with breakfast., Disp: 30 tablet, Rfl: 0   Allergies  Allergen Reactions   Penicillins Rash    History reviewed. No pertinent past medical history.   History reviewed. No pertinent surgical history.  No family history on file.  Social History   Tobacco Use   Smoking status: Never   Smokeless tobacco: Never  Vaping Use   Vaping status: Never Used  Substance Use Topics   Alcohol use: Yes    Comment: occ   Drug use: Not Currently    ROS   Objective:   Vitals: BP 119/76 (BP Location: Right Arm)   Pulse 80   Temp 98.2 F (36.8 C) (Oral)   Resp 16   SpO2 98%   Physical  Exam Constitutional:      General: He is not in acute distress.    Appearance: Normal appearance. He is well-developed and normal weight. He is not ill-appearing, toxic-appearing or diaphoretic.  HENT:     Head: Normocephalic and atraumatic.     Right Ear: External ear normal.     Left Ear: External ear normal.     Nose: Nose normal.     Mouth/Throat:     Pharynx: Oropharynx is clear.  Eyes:     General: No scleral icterus.       Right eye: No discharge.        Left eye: No discharge.     Extraocular Movements: Extraocular movements intact.  Cardiovascular:     Rate and Rhythm: Normal rate.  Pulmonary:     Effort: Pulmonary effort is normal.  Musculoskeletal:       Hands:     Cervical back: Normal range of motion.  Neurological:     Mental Status: He is alert and oriented to person, place, and time.  Psychiatric:        Mood and Affect: Mood normal.        Behavior: Behavior normal.        Thought Content: Thought content normal.        Judgment: Judgment normal.     Patient has an obvious fracture at the base of the first left proximal  phalanx.  Assessment and Plan :   PDMP not reviewed this encounter.  1. Closed displaced fracture of proximal phalanx of left thumb, initial encounter   2. Strain of intrinsic muscle of left thumb    Patient has an orthopedist at [facility].  Advised that he follow-up with him as soon as possible.  In the meantime, wear the thumb spica splint at all times.  Naproxen for pain and inflammation.  Counseled patient on potential for adverse effects with medications prescribed/recommended today, ER and return-to-clinic precautions discussed, patient verbalized understanding.    Wallis Bamberg, [place] 06/10/23 1547

## 2023-06-13 DIAGNOSIS — M79642 Pain in left hand: Secondary | ICD-10-CM | POA: Diagnosis not present

## 2023-06-13 DIAGNOSIS — S62512A Displaced fracture of proximal phalanx of left thumb, initial encounter for closed fracture: Secondary | ICD-10-CM | POA: Diagnosis not present

## 2023-06-20 ENCOUNTER — Other Ambulatory Visit (HOSPITAL_COMMUNITY): Payer: Self-pay

## 2023-07-12 DIAGNOSIS — S62512D Displaced fracture of proximal phalanx of left thumb, subsequent encounter for fracture with routine healing: Secondary | ICD-10-CM | POA: Diagnosis not present

## 2023-08-08 DIAGNOSIS — S62512A Displaced fracture of proximal phalanx of left thumb, initial encounter for closed fracture: Secondary | ICD-10-CM | POA: Diagnosis not present

## 2023-08-08 DIAGNOSIS — M25642 Stiffness of left hand, not elsewhere classified: Secondary | ICD-10-CM | POA: Diagnosis not present
